# Patient Record
Sex: Male | Born: 1995 | Hispanic: Yes | Marital: Single | Smoking: Current some day smoker
Health system: Southern US, Community
[De-identification: ages and names within clinical notes are randomized; demographics above are authoritative.]

## PROBLEM LIST (undated history)

## (undated) DIAGNOSIS — J45909 Unspecified asthma, uncomplicated: Secondary | ICD-10-CM

---
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
## 2017-02-01 ENCOUNTER — Emergency Department
Admission: EM | Admit: 2017-02-01 | Discharge: 2017-02-01 | Disposition: A | Discharge status: Home / Self Care | Payer: Self-pay | Attending: Emergency Medicine | Admitting: Emergency Medicine

## 2017-02-01 ENCOUNTER — Other Ambulatory Visit: Payer: Self-pay

## 2017-02-01 DIAGNOSIS — J039 Acute tonsillitis, unspecified: Secondary | ICD-10-CM | POA: Insufficient documentation

## 2017-02-01 DIAGNOSIS — F17200 Nicotine dependence, unspecified, uncomplicated: Secondary | ICD-10-CM | POA: Insufficient documentation

## 2017-02-01 LAB — POCT RAPID STREP A: Streptococcus, Group A Screen (Direct): NEGATIVE

## 2017-02-01 MED ORDER — PSEUDOEPH-BROMPHEN-DM 30-2-10 MG/5ML PO SYRP: 5.0000 mL | ORAL_SOLUTION | Freq: Four times a day (QID) | ORAL | 0 refills | Status: AC | PRN

## 2017-02-01 MED ORDER — LIDOCAINE VISCOUS 2 % MT SOLN: 5.0000 mL | Freq: Four times a day (QID) | OROMUCOSAL | 0 refills | Status: AC | PRN

## 2017-02-01 MED ORDER — LIDOCAINE VISCOUS 2 % MT SOLN
15.0000 mL | Freq: Once | OROMUCOSAL | Status: AC
  Administered 2017-02-01: 15 mL via OROMUCOSAL
  Filled 2017-02-01: qty 15

## 2017-02-01 MED ORDER — ACETAMINOPHEN-CODEINE 120-12 MG/5ML PO SOLN
10.0000 mL | Freq: Once | ORAL | Status: AC
  Administered 2017-02-01: 10 mL via ORAL
  Filled 2017-02-01: qty 2

## 2017-02-01 MED ORDER — METHYLPREDNISOLONE 4 MG PO TBPK: ORAL_TABLET | ORAL | 0 refills | Status: AC

## 2017-02-01 MED ORDER — DEXAMETHASONE SODIUM PHOSPHATE 10 MG/ML IJ SOLN
10.0000 mg | Freq: Once | INTRAMUSCULAR | Status: AC
  Administered 2017-02-01: 10 mg via INTRAMUSCULAR
  Filled 2017-02-01: qty 1

## 2017-02-01 MED ORDER — ACETAMINOPHEN-CODEINE 120-12 MG/5ML PO SUSP: 10.0000 mL | Freq: Four times a day (QID) | ORAL | 0 refills | Status: AC | PRN

## 2017-02-01 NOTE — ED Notes (Signed)
Pt throat red and swollen, he has muffled speech and is unable to swallow his saliva

## 2017-02-01 NOTE — ED Triage Notes (Addendum)
Sore throat X 4 days. Pt has been taking Nyquil for pain. Runny nose. Pt alert and oriented X4, active, cooperative, pt in NAD. RR even and unlabored, color WNL.   Tonsils swollen, not touching. Pt spitting out saliva due to pain with swallowing. RR even and unlabored, color WNL.

## 2017-02-01 NOTE — ED Provider Notes (Signed)
[facility] Emergency Department Provider Note   ____________________________________________   None    (approximate)  I have reviewed the triage vital signs and the nursing notes.   HISTORY  Chief Complaint Sore Throat    HPI Jeffery Allison is a 21 y.o. male patient complaining of sore throat for 4 days. Patient state edematous tonsils and pain with swallowing.Patient also has nasal congestion intermittently runny nose. Patient has been taking NyQuil with mild transient relief. Patient rates pain as 8/10. Patient described a pain as "sore".   History reviewed. No pertinent past medical history.  There are no active problems to display for this patient.   History reviewed. No pertinent surgical history.  Prior to Admission medications   Medication Sig Start Date End Date Taking? Authorizing Provider  acetaminophen-codeine 120-12 MG/5ML suspension Take 10 mLs every 6 (six) hours as needed by mouth for pain. 02/01/17 02/01/18  Joni ReiningSmith, Ronald K, PA-C  brompheniramine-pseudoephedrine-DM 30-2-10 MG/5ML syrup Take 5 mLs 4 (four) times daily as needed by mouth. 02/01/17   Joni ReiningSmith, Ronald K, PA-C  lidocaine (XYLOCAINE) 2 % solution Use as directed 5 mLs every 6 (six) hours as needed in the mouth or throat for mouth pain. 02/01/17   Joni ReiningSmith, Ronald K, PA-C  methylPREDNISolone (MEDROL DOSEPAK) 4 MG TBPK tablet Take Tapered dose as directed 02/01/17   Joni ReiningSmith, Ronald K, PA-C    Allergies Patient has no known allergies.  No family history on file.  Social History Social History   Tobacco Use  . Smoking status: Current Some Day Smoker  Substance Use Topics  . Alcohol use: Yes    Frequency: Never  . Drug use: Not on file    Review of Systems  Constitutional: No fever/chills Eyes: No visual changes. ENT: Sore throat, nose congestion and runny nose Cardiovascular: Denies chest pain. Respiratory: Denies shortness of breath. Gastrointestinal: No  abdominal pain.  No nausea, no vomiting.  No diarrhea.  No constipation. Genitourinary: Negative for dysuria. Musculoskeletal: Negative for back pain. Skin: Negative for rash. Neurological: Negative for headaches, focal weakness or numbness.   ____________________________________________   PHYSICAL EXAM:  VITAL SIGNS: ED Triage Vitals [02/01/17 1346]  Enc Vitals Group     BP 140/88     Pulse Rate 82     Resp 18     Temp 98.8 F (37.1 C)     Temp Source Oral     SpO2 98 %     Weight 250 lb (113.4 kg)     Height 5\' 7"  (1.702 m)     Head Circumference      Peak Flow      Pain Score 8     Pain Loc      Pain Edu?      Excl. in GC?     Constitutional: Alert and oriented. Well appearing and in no acute distress. Eyes: Conjunctivae are normal. PERRL. EOMI. Head: Atraumatic. Nose: Edematous erythematous bilateral tonsils Mouth/Throat: Mucous membranes are moist.  Oropharynx non-erythematous. Neck: No stridor.  No cervical spine tenderness to palpation. Hematological/Lymphatic/Immunilogical: Bilateral cervical lymphadenopathy. Cardiovascular: Normal rate, regular rhythm. Grossly normal heart sounds.  Good peripheral circulation. Respiratory: Normal respiratory effort.  No retractions. Lungs CTAB. Neurologic:  Normal speech and language. No gross focal neurologic deficits are appreciated. No gait instability. Skin:  Skin is warm, dry and intact. No rash noted. Psychiatric: Mood and affect are normal. Speech and behavior are normal.  ____________________________________________   LABS (all labs ordered are listed, but  only abnormal results are displayed)  Labs Reviewed  POCT RAPID STREP A   ____________________________________________  EKG   ____________________________________________  RADIOLOGY  No results found.  ____________________________________________   PROCEDURES  Procedure(s) performed: None  Procedures  Critical Care performed:  No  ____________________________________________   INITIAL IMPRESSION / ASSESSMENT AND PLAN / ED COURSE  As part of my medical decision making, I reviewed the following data within the electronic MEDICAL RECORD NUMBER    Sore throat secondary to tonsillitis. Patient advised a rapid strep test was negative culture is pending. Patient given supportive care consisting of viscous lidocaine, Decadron injection, and Tylenol elixir with codeine. Patient given discharge care instruction work no. Patient advised to take medication as directed. Patient advised follow-up with the ENT clinic if no improvement in 5 days. Return to ED if condition worsens.    ____________________________________________   FINAL CLINICAL IMPRESSION(S) / ED DIAGNOSES  Final diagnoses:  Tonsillitis     ED Discharge Orders        Ordered    methylPREDNISolone (MEDROL DOSEPAK) 4 MG TBPK tablet     02/01/17 1519    acetaminophen-codeine 120-12 MG/5ML suspension  Every 6 hours PRN     02/01/17 1519    brompheniramine-pseudoephedrine-DM 30-2-10 MG/5ML syrup  4 times daily PRN     02/01/17 1519    lidocaine (XYLOCAINE) 2 % solution  Every 6 hours PRN     02/01/17 1520       Note:  This document was prepared using Dragon voice recognition software and may include unintentional dictation errors.    Joni ReiningSmith, Ronald K, PA-C 02/01/17 1528    Nita SickleVeronese, Allen, MD 02/07/17 [phone]

## 2017-02-01 NOTE — ED Notes (Signed)
Pt no longer has muffled speech. Is able to swallow. Ambulatory to POV with family member. VSS. NAD. Respirations are equal and non-labored. Skin PWD. No questions or concerns during discharge.

## 2017-02-01 NOTE — Discharge Instructions (Signed)
Take viscous lidocaine to swish and swallow prior to taking other medications

## 2017-02-03 ENCOUNTER — Other Ambulatory Visit: Payer: Self-pay

## 2017-02-03 ENCOUNTER — Emergency Department
Admission: EM | Admit: 2017-02-03 | Discharge: 2017-02-03 | Disposition: A | Discharge status: Home / Self Care | Payer: Self-pay | Attending: Emergency Medicine | Admitting: Emergency Medicine

## 2017-02-03 ENCOUNTER — Encounter: Payer: Self-pay | Admitting: Emergency Medicine

## 2017-02-03 DIAGNOSIS — F17200 Nicotine dependence, unspecified, uncomplicated: Secondary | ICD-10-CM | POA: Insufficient documentation

## 2017-02-03 DIAGNOSIS — J039 Acute tonsillitis, unspecified: Secondary | ICD-10-CM | POA: Insufficient documentation

## 2017-02-03 LAB — POCT RAPID STREP A: STREPTOCOCCUS, GROUP A SCREEN (DIRECT): NEGATIVE

## 2017-02-03 MED ORDER — AMOXICILLIN-POT CLAVULANATE 875-125 MG PO TABS: 1.0000 | ORAL_TABLET | Freq: Two times a day (BID) | ORAL | 0 refills | Status: AC

## 2017-02-03 NOTE — Discharge Instructions (Signed)
Please take antibiotics as prescribed.  Continue with other medications.  You may also take ibuprofen as needed for additional pain relief.  If any difficulty swallowing, return to the emergency department.

## 2017-02-03 NOTE — ED Triage Notes (Signed)
States sore throat x 1 week, states now notices sores in mouth.

## 2017-02-03 NOTE — ED Provider Notes (Signed)
[facility] EMERGENCY DEPARTMENT Provider Note   CSN: [id number] Arrival date & time: 02/03/17  [id number]     History   Chief Complaint Chief Complaint  Patient presents with  . Sore Throat    HPI Jeffery Allison is a 21 y.o. male resents to the emergency department for evaluation of sore throat pain.  Patient has had 6 days of sore throat.  Was seen 2 days ago at the emergency department and diagnosed with tonsillitis.  Rapid strep test was negative.  He was given a prescription for lidocaine solution, Medrol Dosepak, Tylenol with codeine suspension.  Patient states tonsillar swelling significantly improved but now he is having a lot of redness and pain in his uvula.  He is also having some continued sore throat pain.  He denies any difficulty swallowing, opening his mouth or hot potato voice.  He is not drooling.  Pain is moderate but improved with current medications.  He denies any rashes, chest pain, abdominal pain.  HPI  History reviewed. No pertinent past medical history.  There are no active problems to display for this patient.   History reviewed. No pertinent surgical history.     Home Medications    Prior to Admission medications   Medication Sig Start Date End Date Taking? Authorizing Provider  acetaminophen-codeine 120-12 MG/5ML suspension Take 10 mLs every 6 (six) hours as needed by mouth for pain. 02/01/17 02/01/18  Joni ReiningSmith, Ronald K, PA-C  amoxicillin-clavulanate (AUGMENTIN) 875-125 MG tablet Take 1 tablet every 12 (twelve) hours for 10 days by mouth. 02/03/17 02/13/17  Evon SlackGaines, Thomas C, PA-C  brompheniramine-pseudoephedrine-DM 30-2-10 MG/5ML syrup Take 5 mLs 4 (four) times daily as needed by mouth. 02/01/17   Joni ReiningSmith, Ronald K, PA-C  lidocaine (XYLOCAINE) 2 % solution Use as directed 5 mLs every 6 (six) hours as needed in the mouth or throat for mouth pain. 02/01/17   Joni ReiningSmith, Ronald K, PA-C  methylPREDNISolone (MEDROL DOSEPAK) 4 MG TBPK tablet  Take Tapered dose as directed 02/01/17   Joni ReiningSmith, Ronald K, PA-C    Family History No family history on file.  Social History Social History   Tobacco Use  . Smoking status: Current Some Day Smoker  Substance Use Topics  . Alcohol use: Yes    Frequency: Never  . Drug use: Not on file     Allergies   Patient has no known allergies.   Review of Systems Review of Systems  Constitutional: Negative.  Negative for activity change, appetite change, chills and fever.  HENT: Positive for sore throat. Negative for congestion, ear pain, mouth sores, rhinorrhea, sinus pressure and trouble swallowing.   Eyes: Negative for photophobia, pain and discharge.  Respiratory: Negative for cough, chest tightness and shortness of breath.   Cardiovascular: Negative for chest pain and leg swelling.  Gastrointestinal: Negative for abdominal distention, abdominal pain, diarrhea, nausea and vomiting.  Genitourinary: Negative for difficulty urinating and dysuria.  Musculoskeletal: Negative for arthralgias, back pain and gait problem.  Skin: Negative for color change and rash.  Neurological: Negative for dizziness and headaches.  Hematological: Negative for adenopathy.  Psychiatric/Behavioral: Negative for agitation and behavioral problems.     Physical Exam Updated Vital Signs BP (!) 110/55   Pulse 67   Temp (!) 97.5 F (36.4 C) (Oral)   Resp 20   Ht 5\' 6"  (1.676 m)   Wt 108.9 kg (240 lb)   SpO2 98%   BMI 38.74 kg/m   Physical Exam  Constitutional: He is oriented to  person, place, and time. He appears well-developed and well-nourished.  HENT:  Head: Normocephalic and atraumatic.  Right Ear: External ear normal.  Left Ear: External ear normal.  Nose: Nose normal.  Mouth/Throat: No oropharyngeal exudate.  No tonsillar swelling or exudates.  Mild tonsillar erythema.  Uvula is midline with no swelling or abscess formation but uvula is erythematous.  Uvula is tender to palpation.   Eyes:  Conjunctivae are normal.  Neck: Normal range of motion.  Cardiovascular: Normal rate, regular rhythm and normal heart sounds.  Pulmonary/Chest: Effort normal. No stridor. No respiratory distress. He has no wheezes. He has no rales.  Abdominal: Soft. There is no tenderness.  no left upper quadrant tenderness.  Musculoskeletal: Normal range of motion.  Lymphadenopathy:    He has cervical adenopathy.  Neurological: He is alert and oriented to person, place, and time.  Skin: Skin is warm. No rash noted.  Psychiatric: He has a normal mood and affect. His behavior is normal. Thought content normal.     ED Treatments / Results  Labs (all labs ordered are listed, but only abnormal results are displayed) Labs Reviewed  CULTURE, GROUP A STREP [facility]  POCT RAPID STREP A    EKG  EKG Interpretation None       Radiology No results found.  Procedures Procedures (including critical care time)  Medications Ordered in ED Medications - No data to display   Initial Impression / Assessment and Plan / ED Course  I have reviewed the triage vital signs and the nursing notes.  Pertinent labs & imaging results that were available during my care of the patient were reviewed by me and considered in my medical decision making (see chart for details).     21 year old male with tonsillitis.  Rapid strep test today is negative but cultures are sent.  He has had persistent sore throat with absence of cough congestion runny nose.  He has had some body aches.  Due to persistent pain and increased in uvula redness and pain will start antibiotic.  He is educated on signs and symptoms return to the ED for such as any trismus, difficulty swallowing, increasing pain, fevers, worsening symptoms or urgent changes in his health.  Final Clinical Impressions(s) / ED Diagnoses   Final diagnoses:  Tonsillitis    ED Discharge Orders        Ordered    amoxicillin-clavulanate (AUGMENTIN) 875-125 MG tablet   Every 12 hours     02/03/17 0934       Evon SlackGaines, Thomas C, PA-C 02/03/17 [phone]    Pershing ProudSchaevitz, Myra Rudeavid Matthew, MD 02/03/17 [phone]

## 2017-02-04 ENCOUNTER — Encounter: Payer: Self-pay | Admitting: Emergency Medicine

## 2017-02-04 ENCOUNTER — Emergency Department: Payer: Self-pay

## 2017-02-04 ENCOUNTER — Emergency Department
Admission: EM | Admit: 2017-02-04 | Discharge: 2017-02-04 | Disposition: A | Discharge status: Home / Self Care | Payer: Self-pay | Attending: Emergency Medicine | Admitting: Emergency Medicine

## 2017-02-04 ENCOUNTER — Other Ambulatory Visit: Payer: Self-pay

## 2017-02-04 DIAGNOSIS — F17200 Nicotine dependence, unspecified, uncomplicated: Secondary | ICD-10-CM | POA: Insufficient documentation

## 2017-02-04 DIAGNOSIS — J039 Acute tonsillitis, unspecified: Secondary | ICD-10-CM | POA: Insufficient documentation

## 2017-02-04 MED ORDER — IOPAMIDOL (ISOVUE-300) INJECTION 61%
75.0000 mL | Freq: Once | INTRAVENOUS | Status: AC | PRN
  Administered 2017-02-04: 75 mL via INTRAVENOUS

## 2017-02-04 MED ORDER — KETOROLAC TROMETHAMINE 30 MG/ML IJ SOLN
30.0000 mg | Freq: Once | INTRAMUSCULAR | Status: AC
  Administered 2017-02-04: 30 mg via INTRAVENOUS
  Filled 2017-02-04: qty 1

## 2017-02-04 NOTE — Discharge Instructions (Signed)
Please continue to take your antibiotic as prescribed previous.  Follow-up closely with ear nose and throat, Dr. Andee PolesVaught.  Call tomorrow to schedule a visit as soon as possible.  Return to the emergency room right away if you develop difficulty breathing, or unable to swallow, develop high fever, vomiting, feel weak dizzy, develop a severe headache or other new concerns arise.

## 2017-02-04 NOTE — ED Provider Notes (Signed)
[facility] Emergency Department Provider Note   ____________________________________________   First MD Initiated Contact with Patient 02/04/17 1134     (approximate)  I have reviewed the triage vital signs and the nursing notes.   HISTORY  Chief Complaint Sore Throat    HPI Jeffery Allison is a 21 y.o. male here for evaluation of sore throat  Patient reports he has had a sore throat for about a week, he started an antibiotic yesterday and is taken 2 doses.  Reports he continues to feel like he has a very sore throat, makes it painful to swallow.  He is able to speak, denies any trouble speaking, denies any shortness of breath, no fever but did have some body aches and chills earlier in the week.  He just reports that he is continued to feel very sore in his throat, and when he swallows it hurts quite a bit.  No chest pain or trouble breathing.  No nausea or vomiting.  Currently on Augmentin, reports taking 2 doses.  Pain reported as moderate to severe with swallowing.  History reviewed. No pertinent past medical history.  There are no active problems to display for this patient.   History reviewed. No pertinent surgical history.  Prior to Admission medications   Medication Sig Start Date End Date Taking? Authorizing Provider  acetaminophen-codeine 120-12 MG/5ML suspension Take 10 mLs every 6 (six) hours as needed by mouth for pain. 02/01/17 02/01/18  Joni Reining, PA-C  amoxicillin-clavulanate (AUGMENTIN) 875-125 MG tablet Take 1 tablet every 12 (twelve) hours for 10 days by mouth. 02/03/17 02/13/17  Evon Slack, PA-C  brompheniramine-pseudoephedrine-DM 30-2-10 MG/5ML syrup Take 5 mLs 4 (four) times daily as needed by mouth. 02/01/17   Joni Reining, PA-C  lidocaine (XYLOCAINE) 2 % solution Use as directed 5 mLs every 6 (six) hours as needed in the mouth or throat for mouth pain. 02/01/17   Joni Reining, PA-C  methylPREDNISolone (MEDROL  DOSEPAK) 4 MG TBPK tablet Take Tapered dose as directed 02/01/17   Joni Reining, PA-C    Allergies Patient has no known allergies.  No family history on file.  Social History Social History   Tobacco Use  . Smoking status: Current Some Day Smoker  . Smokeless tobacco: Never Used  Substance Use Topics  . Alcohol use: Yes    Frequency: Never  . Drug use: Not on file    Review of Systems Constitutional: No fever/chills but some body aches over the last week Eyes: No visual changes. ENT: See HPI Cardiovascular: Denies chest pain. Respiratory: Denies shortness of breath. Gastrointestinal: No abdominal pain.  No nausea, no vomiting.  No diarrhea.  No constipation. Genitourinary: Negative for dysuria. Musculoskeletal: Negative for back pain. Skin: Negative for rash. Neurological: Negative for headaches, focal weakness or numbness.    ____________________________________________   PHYSICAL EXAM:  VITAL SIGNS: ED Triage Vitals [02/04/17 0958]  Enc Vitals Group     BP 119/70     Pulse Rate 95     Resp 16     Temp 98.7 F (37.1 C)     Temp Source Oral     SpO2 99 %     Weight 240 lb (108.9 kg)     Height 5\' 6"  (1.676 m)     Head Circumference      Peak Flow      Pain Score      Pain Loc      Pain Edu?  Excl. in GC?     Constitutional: Alert and oriented. Well appearing and in no acute distress. Eyes: Conjunctivae are normal. Head: Atraumatic. Nose: No congestion/rhinnorhea. Mouth/Throat: Mucous membranes are moist.  He has bilateral tonsillar hypertrophy, uvula is midline and does not appear swollen.  There is erythema of the tonsillar pillars, and the left tonsil appears slightly more swollen than the right.  No purulence is noted. Neck: No stridor.  No muffled voice.  No anterior neck swelling.  Some mild anterior cervical adenopathy bilateral. Cardiovascular: Normal rate, regular rhythm. Grossly normal heart sounds.  Good peripheral  circulation. Respiratory: Normal respiratory effort.  No retractions. Lungs CTAB. Gastrointestinal: Soft and nontender. No distention. Musculoskeletal: No lower extremity tenderness nor edema. Neurologic:  Normal speech and language. No gross focal neurologic deficits are appreciated.  Skin:  Skin is warm, dry and intact. No rash noted. Psychiatric: Mood and affect are normal. Speech and behavior are normal.  ____________________________________________   LABS (all labs ordered are listed, but only abnormal results are displayed)  Labs Reviewed - No data to display ____________________________________________  EKG   ____________________________________________  RADIOLOGY  Ct Soft Tissue Neck W Contrast  Result Date: 02/04/2017 CLINICAL DATA:  Left-sided neck pain for 1 week. The patient is currently on antibiotics. Sore throat/stridor, epiglottis or tonsillitis suspected. EXAM: CT NECK WITH CONTRAST TECHNIQUE: Multidetector CT imaging of the neck was performed using the standard protocol following the bolus administration of intravenous contrast. CONTRAST:  75mL ISOVUE-300 IOPAMIDOL (ISOVUE-300) INJECTION 61% COMPARISON:  None. FINDINGS: Pharynx and larynx: The palatine tonsils are enlarged bilaterally, left greater than right. There is heterogeneous enhancement of the left palatine tonsil with an ill-defined low-density area measurement 1.9 cm. No discrete drainable abscess is present. Inflammatory changes extend into the left parapharyngeal fat. Edematous changes extend to the aryepiglottic fold on the left and into the posterior left oropharynx. The airway is patent. Soft palate is no epiglottis are within normal limits. Vocal cords are midline and symmetric. Salivary glands: Submandibular and parotid glands are normal bilaterally. Thyroid: Normal. Lymph nodes: Left than right enlarged level 2 and level 3 lymph nodes are likely reactive. Vascular: Negative. Limited intracranial: Within  normal limits. Visualized orbits: Visualized orbits are normal. Mastoids and visualized paranasal sinuses: The paranasal sinuses and mastoid air cells are clear. Skeleton: Vertebral body heights alignment are within normal limits. No focal lytic or blastic lesions are present. Upper chest: The lung apices are clear. IMPRESSION: 1. Left-sided tonsillitis and pharyngitis with inflammatory changes of the left palatine tonsil extending into the left parapharyngeal fat. 2. Low-density area within the lateral aspect of the left palatine tonsil without a discrete drainable abscess. 3. Reactive left-sided cervical adenopathy. Electronically Signed   By: Marin Robertshristopher  Mattern M.D.   On: 02/04/2017 12:49   CT reviewed, no evidence of an acute abscess.  Consistent with tonsillitis/pharyngitis.  Airway is patent  ____________________________________________   PROCEDURES  Procedure(s) performed: None  Procedures  Critical Care performed: No  ____________________________________________   INITIAL IMPRESSION / ASSESSMENT AND PLAN / ED COURSE  Pertinent labs & imaging results that were available during my care of the patient were reviewed by me and considered in my medical decision making (see chart for details).  Patient presents with an obvious tonsillitis, but given his representation today and notably some slightly asymmetric tonsillar hypertrophy, primarily showing more swelling on the left than the right I will order a CT soft tissue to evaluate and exclude abscess.  Overall I suspect he likely  suffering from acute tonsillitis, just started Augmentin and I would not yet expect a significant response to therapy.  He is nontoxic and well-appearing, does not appear to be septic.       ----------------------------------------- 12:54 PM on 02/04/2017 -----------------------------------------  Patient resting comfortably in no distress.  Reviewed CT findings, recommendations to continue on with his  current treatment including antibiotic as previously prescribed and follow-up with your nose and throat. Return precautions and treatment recommendations and follow-up discussed with the patient who is agreeable with the plan.  ____________________________________________   FINAL CLINICAL IMPRESSION(S) / ED DIAGNOSES  Final diagnoses:  Acute tonsillitis, unspecified etiology      NEW MEDICATIONS STARTED DURING THIS VISIT:  This SmartLink is deprecated. Use AVSMEDLIST instead to display the medication list for a patient.   Note:  This document was prepared using Dragon voice recognition software and may include unintentional dictation errors.     Sharyn CreamerQuale, Eline Geng, MD 02/04/17 1255

## 2017-02-04 NOTE — ED Notes (Signed)
Patient transported to CT at this time. 

## 2017-02-04 NOTE — ED Triage Notes (Signed)
Has been seen through ED three times for c/o sore throat.  Seen most recently yesterday.  Returns today for same complaint.  Started on an antibiotic yesterday.

## 2017-02-05 LAB — CULTURE, GROUP A STREP (THRC)

## 2017-02-06 NOTE — Progress Notes (Signed)
ED CULTURE REPORT   Microbiology: Recent Results (from the past 720 hour(s))  Culture, group A strep     Status: None   Collection Time: 02/03/17  9:20 AM  Result Value Ref Range Status   Specimen Description THROAT  Final   Special Requests NONE  Final   Culture ABUNDANT GROUP A STREP (S.PYOGENES) ISOLATED  Final   Report Status 02/05/2017 FINAL  Final    21 yo male admitted to ED with sore throat. Patient was discharged with 10 days of Augmentin.  ED culture has since grown Group A Strep. Spoke with ED MD Dr. Roxan Hockeyobinson who agreed that no further action is needed at this time.    Yolanda BonineHannah Lifsey, PharmD Pharmacy Resident 02/06/2017,4:06 PM

## 2017-10-18 ENCOUNTER — Emergency Department
Admission: EM | Admit: 2017-10-18 | Discharge: 2017-10-18 | Disposition: A | Discharge status: Home / Self Care | Payer: Self-pay | Attending: Emergency Medicine | Admitting: Emergency Medicine

## 2017-10-18 ENCOUNTER — Other Ambulatory Visit: Payer: Self-pay

## 2017-10-18 DIAGNOSIS — Z5321 Procedure and treatment not carried out due to patient leaving prior to being seen by health care provider: Secondary | ICD-10-CM | POA: Insufficient documentation

## 2017-10-18 DIAGNOSIS — R1110 Vomiting, unspecified: Secondary | ICD-10-CM | POA: Insufficient documentation

## 2017-10-18 HISTORY — DX: Unspecified asthma, uncomplicated: J45.909

## 2017-10-18 LAB — CBC
HCT: 51.8 % (ref 40.0–52.0)
Hemoglobin: 18.1 g/dL — ABNORMAL HIGH (ref 13.0–18.0)
MCH: 30.6 pg (ref 26.0–34.0)
MCHC: 34.9 g/dL (ref 32.0–36.0)
MCV: 87.7 fL (ref 80.0–100.0)
Platelets: 399 10*3/uL (ref 150–440)
RBC: 5.91 MIL/uL — AB (ref 4.40–5.90)
RDW: 12.9 % (ref 11.5–14.5)
WBC: 11.8 10*3/uL — AB (ref 3.8–10.6)

## 2017-10-18 LAB — COMPREHENSIVE METABOLIC PANEL
ALK PHOS: 105 U/L (ref 38–126)
ALT: 59 U/L — AB (ref 0–44)
AST: 84 U/L — ABNORMAL HIGH (ref 15–41)
Albumin: 5.4 g/dL — ABNORMAL HIGH (ref 3.5–5.0)
Anion gap: 14 (ref 5–15)
BUN: 46 mg/dL — ABNORMAL HIGH (ref 6–20)
CALCIUM: 9.7 mg/dL (ref 8.9–10.3)
CO2: 25 mmol/L (ref 22–32)
CREATININE: 1.36 mg/dL — AB (ref 0.61–1.24)
Chloride: 97 mmol/L — ABNORMAL LOW (ref 98–111)
Glucose, Bld: 114 mg/dL — ABNORMAL HIGH (ref 70–99)
Potassium: 3.5 mmol/L (ref 3.5–5.1)
Sodium: 136 mmol/L (ref 135–145)
Total Bilirubin: 2 mg/dL — ABNORMAL HIGH (ref 0.3–1.2)
Total Protein: 9.2 g/dL — ABNORMAL HIGH (ref 6.5–8.1)

## 2017-10-18 LAB — LIPASE, BLOOD: Lipase: 23 U/L (ref 11–51)

## 2017-10-18 NOTE — ED Notes (Signed)
Patient called, no answer. [employer] officer reported he saw patient leave the lobby

## 2017-10-18 NOTE — ED Notes (Signed)
Patient called, no answer.

## 2017-10-18 NOTE — ED Notes (Signed)
Pt called to notify of abnormal lab results to request pt to return for further evaluation. Pt hung up and unable to relay information to pt at this time.

## 2017-10-18 NOTE — ED Triage Notes (Signed)
Pt arrives to ED via POV from home with c/o emesis since Monday and hasn't been able to keep anything down. Pt reports non-stop vomiting of "stomach acid". Pt denies fever, no diarrhea. Pt denies CP, abdominal pain or SHOB. No recent sick contacts or family members with similar s/x's.

## 2017-10-23 ENCOUNTER — Encounter: Payer: Self-pay | Admitting: Emergency Medicine

## 2017-10-23 ENCOUNTER — Emergency Department
Admission: EM | Admit: 2017-10-23 | Discharge: 2017-10-23 | Disposition: A | Discharge status: Home / Self Care | Payer: Self-pay | Attending: Emergency Medicine | Admitting: Emergency Medicine

## 2017-10-23 ENCOUNTER — Other Ambulatory Visit: Payer: Self-pay

## 2017-10-23 ENCOUNTER — Telehealth: Payer: Self-pay | Admitting: Emergency Medicine

## 2017-10-23 DIAGNOSIS — J45909 Unspecified asthma, uncomplicated: Secondary | ICD-10-CM | POA: Insufficient documentation

## 2017-10-23 DIAGNOSIS — F17200 Nicotine dependence, unspecified, uncomplicated: Secondary | ICD-10-CM | POA: Insufficient documentation

## 2017-10-23 DIAGNOSIS — R7989 Other specified abnormal findings of blood chemistry: Secondary | ICD-10-CM

## 2017-10-23 DIAGNOSIS — R945 Abnormal results of liver function studies: Secondary | ICD-10-CM | POA: Insufficient documentation

## 2017-10-23 LAB — COMPREHENSIVE METABOLIC PANEL
ALBUMIN: 4.2 g/dL (ref 3.5–5.0)
ALT: 46 U/L — AB (ref 0–44)
ANION GAP: 8 (ref 5–15)
AST: 44 U/L — ABNORMAL HIGH (ref 15–41)
Alkaline Phosphatase: 75 U/L (ref 38–126)
BUN: 22 mg/dL — ABNORMAL HIGH (ref 6–20)
CHLORIDE: 104 mmol/L (ref 98–111)
CO2: 29 mmol/L (ref 22–32)
Calcium: 9 mg/dL (ref 8.9–10.3)
Creatinine, Ser: 1.05 mg/dL (ref 0.61–1.24)
GFR calc non Af Amer: >60 mL/min (ref >60)
GLUCOSE: 88 mg/dL (ref 70–99)
Potassium: 4.1 mmol/L (ref 3.5–5.1)
SODIUM: 141 mmol/L (ref 135–145)
Total Bilirubin: 0.4 mg/dL (ref 0.3–1.2)
Total Protein: 7.1 g/dL (ref 6.5–8.1)

## 2017-10-23 LAB — LIPASE, BLOOD: Lipase: 27 U/L (ref 11–51)

## 2017-10-23 LAB — CBC WITH DIFFERENTIAL/PLATELET
BASOS PCT: 0 %
Basophils Absolute: 0 10*3/uL (ref 0–0.1)
Eosinophils Absolute: 0.1 10*3/uL (ref 0–0.7)
Eosinophils Relative: 1 %
HEMATOCRIT: 39.4 % — AB (ref 40.0–52.0)
HEMOGLOBIN: 13.6 g/dL (ref 13.0–18.0)
LYMPHS ABS: 2.5 10*3/uL (ref 1.0–3.6)
Lymphocytes Relative: 22 %
MCH: 30.7 pg (ref 26.0–34.0)
MCHC: 34.6 g/dL (ref 32.0–36.0)
MCV: 88.7 fL (ref 80.0–100.0)
MONO ABS: 0.9 10*3/uL (ref 0.2–1.0)
MONOS PCT: 7 %
NEUTROS ABS: 8.2 10*3/uL — AB (ref 1.4–6.5)
NEUTROS PCT: 70 %
Platelets: 299 10*3/uL (ref 150–440)
RBC: 4.44 MIL/uL (ref 4.40–5.90)
RDW: 12.6 % (ref 11.5–14.5)
WBC: 11.8 10*3/uL — ABNORMAL HIGH (ref 3.8–10.6)

## 2017-10-23 MED ORDER — SODIUM CHLORIDE 0.9 % IV BOLUS
1000.0000 mL | Freq: Once | INTRAVENOUS | Status: AC
  Administered 2017-10-23: 1000 mL via INTRAVENOUS

## 2017-10-23 NOTE — ED Provider Notes (Signed)
[facility] Emergency Department Provider Note  ____________________________________________   I have reviewed the triage vital signs and the nursing notes. Where available I have reviewed prior notes and, if possible and indicated, outside hospital notes.    HISTORY  Chief Complaint Abnormal Labs    HPI Jeffery Allison is a 22 y.o. male who presented here a few days ago, 5, because of nausea vomiting and diarrhea, all of the symptoms is resolved since he went home but when he was home, he was called and told that he had abnormal labs he came in for that reason.  He has no other complaints.  He came in only because of our telephone call.  He has not vomited in several days, he has no diarrhea and he has no complaints    Past Medical History:  Diagnosis Date  . Asthma     There are no active problems to display for this patient.   History reviewed. No pertinent surgical history.  Prior to Admission medications   Medication Sig Start Date End Date Taking? Authorizing Provider  acetaminophen-codeine 120-12 MG/5ML suspension Take 10 mLs every 6 (six) hours as needed by mouth for pain. 02/01/17 02/01/18  Joni ReiningSmith, Ronald K, PA-C  brompheniramine-pseudoephedrine-DM 30-2-10 MG/5ML syrup Take 5 mLs 4 (four) times daily as needed by mouth. 02/01/17   Joni ReiningSmith, Ronald K, PA-C  lidocaine (XYLOCAINE) 2 % solution Use as directed 5 mLs every 6 (six) hours as needed in the mouth or throat for mouth pain. 02/01/17   Joni ReiningSmith, Ronald K, PA-C  methylPREDNISolone (MEDROL DOSEPAK) 4 MG TBPK tablet Take Tapered dose as directed 02/01/17   Joni ReiningSmith, Ronald K, PA-C    Allergies Patient has no known allergies.  No family history on file.  Social History Social History   Tobacco Use  . Smoking status: Current Some Day Smoker  . Smokeless tobacco: Never Used  Substance Use Topics  . Alcohol use: Yes    Frequency: Never  . Drug use: Not on file    Review of  Systems Constitutional: No fever/chills Eyes: No visual changes. ENT: No sore throat. No stiff neck no neck pain Cardiovascular: Denies chest pain. Respiratory: Denies shortness of breath. Gastrointestinal:   no vomiting.  No diarrhea.  No constipation. Genitourinary: Negative for dysuria. Musculoskeletal: Negative lower extremity swelling Skin: Negative for rash. Neurological: Negative for severe headaches, focal weakness or numbness.   ____________________________________________   PHYSICAL EXAM:  VITAL SIGNS: ED Triage Vitals [10/23/17 1726]  Enc Vitals Group     BP 129/63     Pulse Rate 87     Resp 18     Temp 98.7 F (37.1 C)     Temp Source Oral     SpO2 99 %     Weight 250 lb (113.4 kg)     Height 5\' 3"  (1.6 m)     Head Circumference      Peak Flow      Pain Score 0     Pain Loc      Pain Edu?      Excl. in GC?     Constitutional: Alert and oriented. Well appearing and in no acute distress. Eyes: Conjunctivae are normal Head: Atraumatic HEENT: No congestion/rhinnorhea. Mucous membranes are moist.  Oropharynx non-erythematous Neck:   Nontender with no meningismus, no masses, no stridor Cardiovascular: Normal rate, regular rhythm. Grossly normal heart sounds.  Good peripheral circulation. Respiratory: Normal respiratory effort.  No retractions. Lungs CTAB. Abdominal: Soft and nontender.  No distention. No guarding no rebound Back:  There is no focal tenderness or step off.  there is no midline tenderness there are no lesions noted. there is no CVA tenderness Musculoskeletal: No lower extremity tenderness, no upper extremity tenderness. No joint effusions, no DVT signs strong distal pulses no edema Neurologic:  Normal speech and language. No gross focal neurologic deficits are appreciated.  Skin:  Skin is warm, dry and intact. No rash noted. Psychiatric: Mood and affect are normal. Speech and behavior are normal.  ____________________________________________    LABS (all labs ordered are listed, but only abnormal results are displayed)  Labs Reviewed  COMPREHENSIVE METABOLIC PANEL - Abnormal; Notable for the following components:      Result Value   BUN 22 (*)    AST 44 (*)    ALT 46 (*)    All other components within normal limits  CBC WITH DIFFERENTIAL/PLATELET - Abnormal; Notable for the following components:   WBC 11.8 (*)    HCT 39.4 (*)    Neutro Abs 8.2 (*)    All other components within normal limits  LIPASE, BLOOD  URINALYSIS, COMPLETE (UACMP) WITH MICROSCOPIC    Pertinent labs  results that were available during my care of the patient were reviewed by me and considered in my medical decision making (see chart for details). ____________________________________________  EKG  I personally interpreted any EKGs ordered by me or triage  ____________________________________________  RADIOLOGY  Pertinent labs & imaging results that were available during my care of the patient were reviewed by me and considered in my medical decision making (see chart for details). If possible, patient and/or family made aware of any abnormal findings.  No results found. ____________________________________________    PROCEDURES  Procedure(s) performed: None  Procedures  Critical Care performed: None  ____________________________________________   INITIAL IMPRESSION / ASSESSMENT AND PLAN / ED COURSE  Pertinent labs & imaging results that were available during my care of the patient were reviewed by me and considered in my medical decision making (see chart for details). Patient had elevated liver function test and appeared dehydrated during a episode of diarrhea and vomiting last week, we called him back to get his liver function tests were elevated, we are able to recheck those, they have almost all correct completely.  Belly is benign he has no ongoing complaints or symptoms.  He has not been vomiting for 4 days, kidney function is  improved, no longer looks to be dehydrated.  Transaminases are ameliorated.  Bilirubin is corrected.  Possible that he passed a gallbladder stone, however, he has no tenderness at this time we will refer him to surgery as an outpatient he is very comfort with this plan has no complaints must go home.  Return precautions follow-up given and understood.    ____________________________________________   FINAL CLINICAL IMPRESSION(S) / ED DIAGNOSES  Final diagnoses:  None      This chart was dictated using voice recognition software.  Despite best efforts to proofread,  errors can occur which can change meaning.       Jeanmarie Plant, MD 10/23/17 [phone]

## 2017-10-23 NOTE — ED Triage Notes (Signed)
Pt presents to ED via POV, states came in on 8/1 for feeling sick but "they were taking too long" so he left prior to being seen by MD, pt states received a call about abnormal labs regarding "either his lungs or his liver". Pt returns today due to abnormal labs but states he feels better than he did on 8/1.

## 2017-10-23 NOTE — Discharge Instructions (Addendum)
We called you back to the emergency room because your liver function tests were elevated.  They have almost completely corrected.  If you have vomiting or abdominal pain it is possible that you may have gallbladder disease but at this time, everything looks okay.  Continue to drink plenty of fluids, follow closely with surgery in the outpatient setting as a precaution, return to the emergency room if you have any new or worsening symptoms specifically persistent vomiting or abdominal pain.

## 2017-10-23 NOTE — Telephone Encounter (Signed)
Called patient due to lwot to inquire about condition and follow up plans.  He is saying he feels better.  I explained that he had concerning labs regarding his kidneys and liver.  He does not have a pcp or insurance.  He asked me to speak to his sister.  I spoke to her and explained that he really needs to have labs repeated.  I told her about urgent care and acute care, but he does not have insurance.  I told her he can always return here.

## 2018-10-02 IMAGING — CT CT NECK W/ CM
4 of 5 series · 16 of 33 positions shown, 18 images · IV contrast (iopamidol)
Comparison: None.

CLINICAL DATA: Left-sided neck pain for 1 week. The patient is
currently on antibiotics. Sore throat/stridor, epiglottis or
tonsillitis suspected.

EXAM:
CT NECK WITH CONTRAST
TECHNIQUE: Multidetector CT imaging of the neck was performed using the
standard protocol following the bolus administration of intravenous
contrast.
CONTRAST:  75mL KGMSDT-D44 IOPAMIDOL (KGMSDT-D44) INJECTION 61%

[Series 2: axial neck · axial · 0.52mm/px · z∈[+368,+506]mm · 4 of 116 slices shown, 5 images]
[im 24/116  soft-tissue]
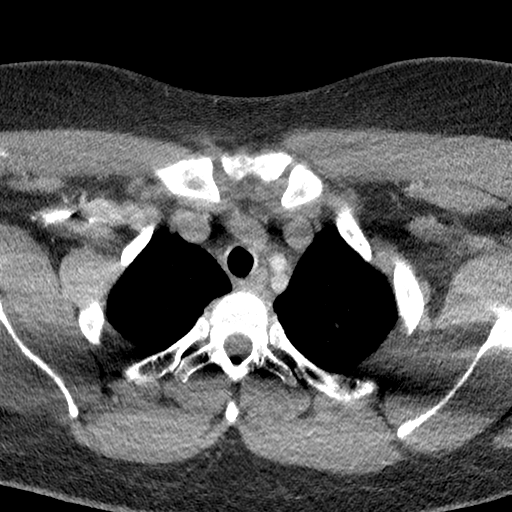
[im 24/116  bone]
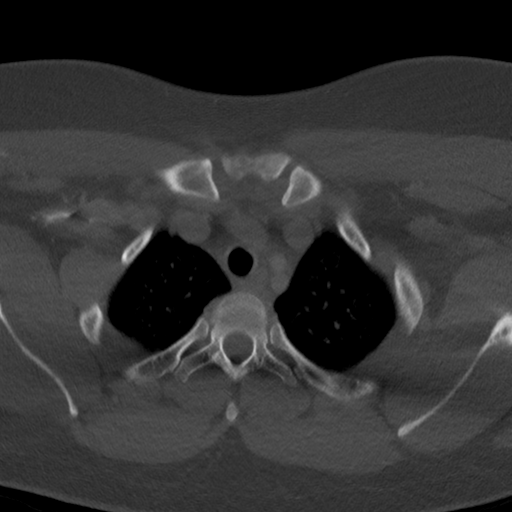
[im 47/116  bone]
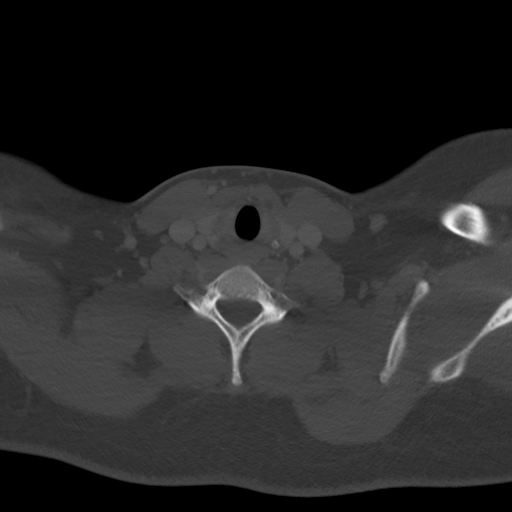
[im 70/116  bone]
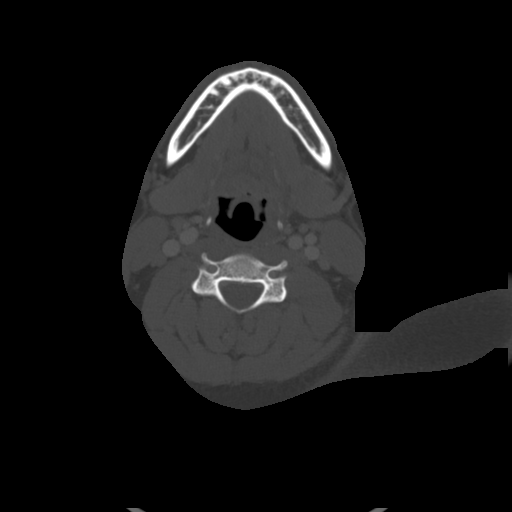
[im 93/116  bone]
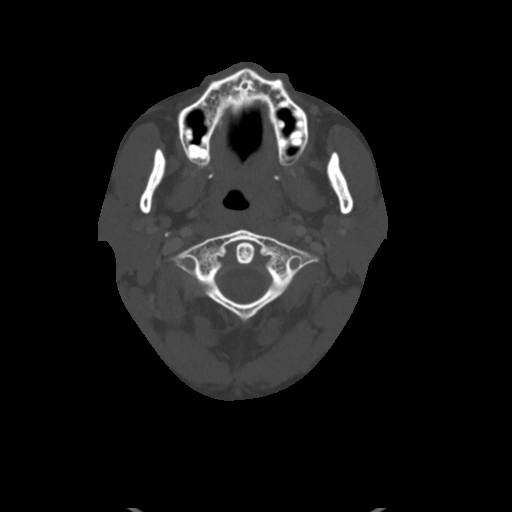

[Series 6: sag neck · sagittal · 0.47mm/px · 5 of 125 slices shown, 6 images]
[im 42/125  bone]
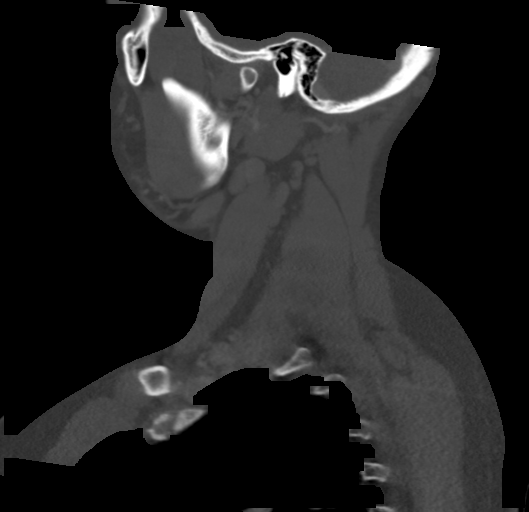
[im 52/125  bone]
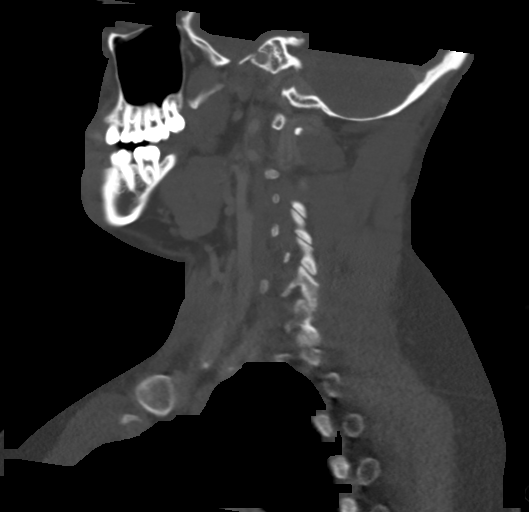
[im 63/125  soft-tissue]
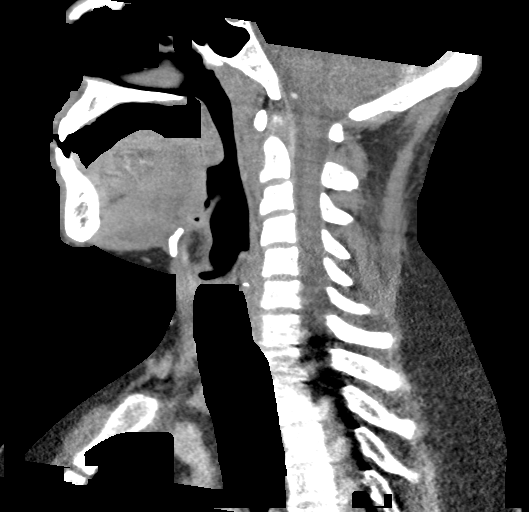
[im 63/125  bone]
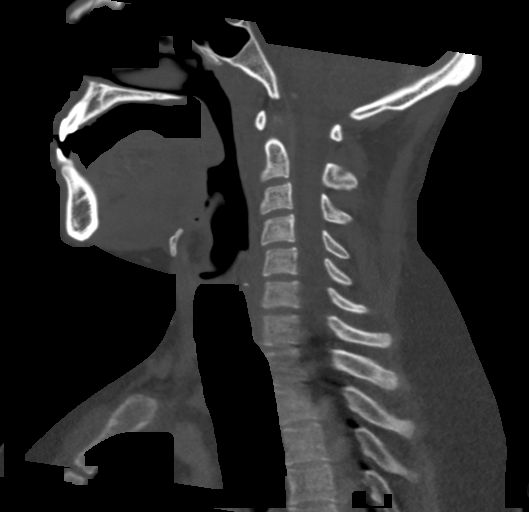
[im 73/125  bone]
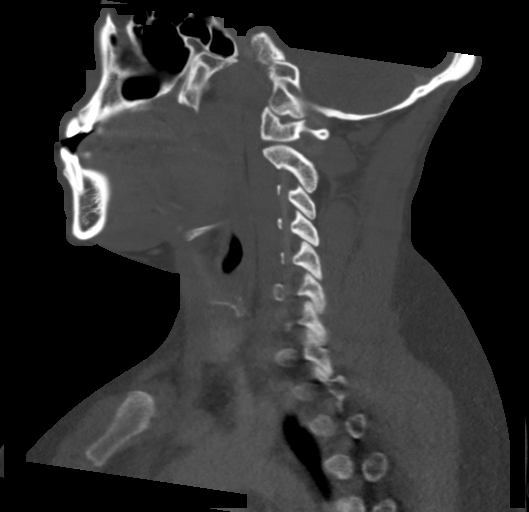
[im 83/125  bone]
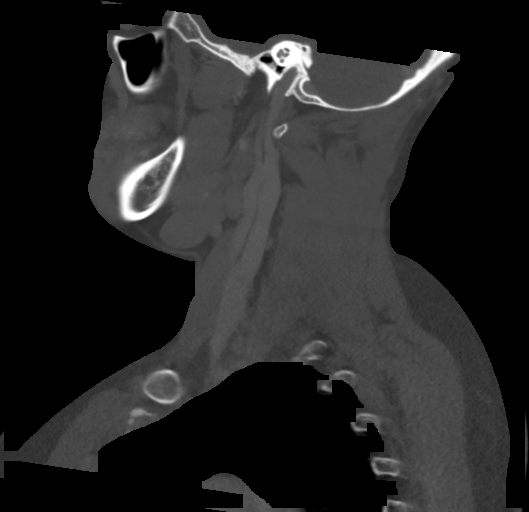

[Series 7: cor neck · coronal · 0.46mm/px · 3 of 113 slices shown]
[im 23/113  bone]
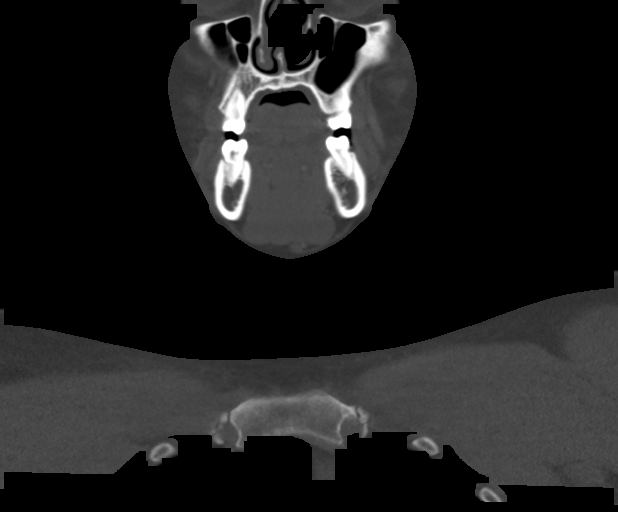
[im 45/113  bone]
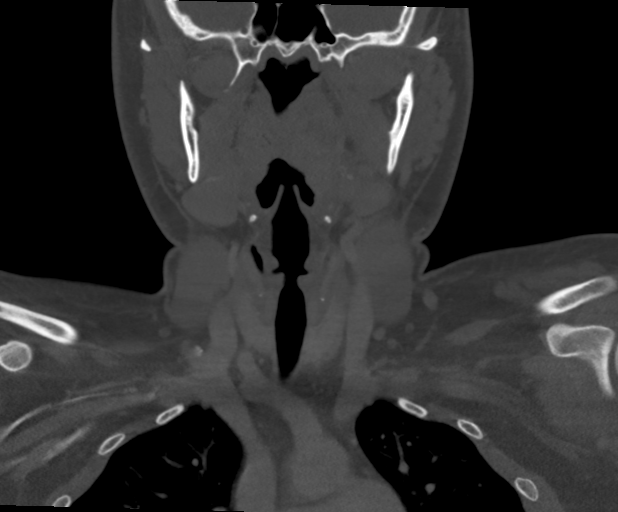
[im 68/113  bone]
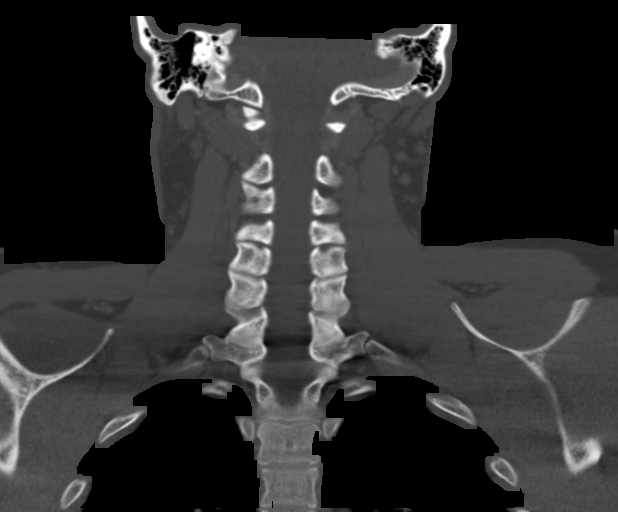

[Series 8: orthogonal ax · axial · 0.46mm/px · z∈[+348,+495]mm · 4 of 125 slices shown]
[im 25/125  bone]
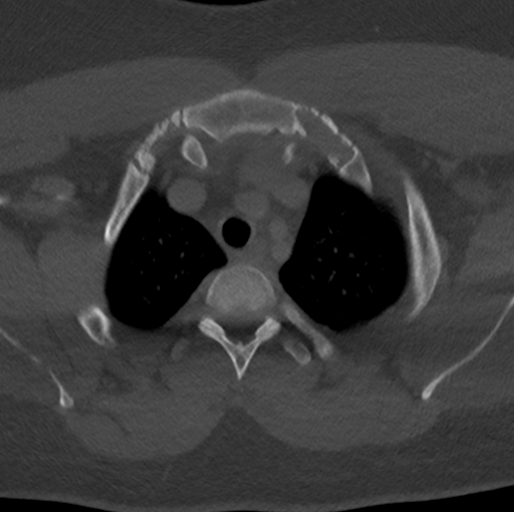
[im 50/125  bone]
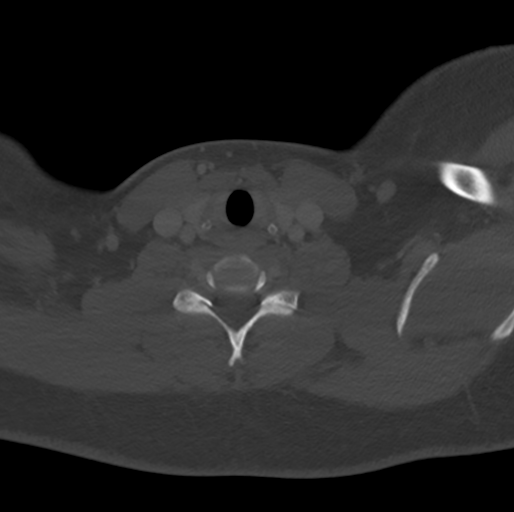
[im 75/125  bone]
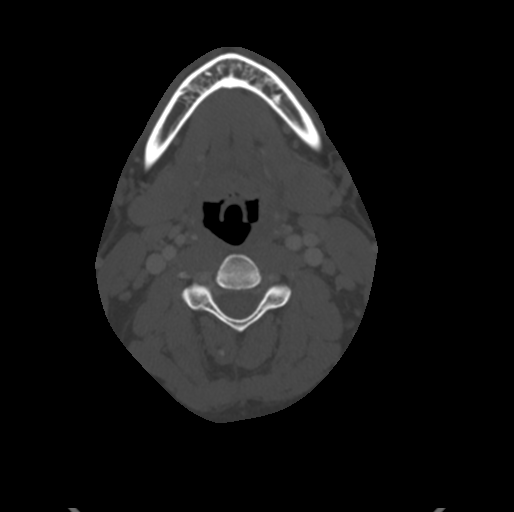
[im 100/125  bone]
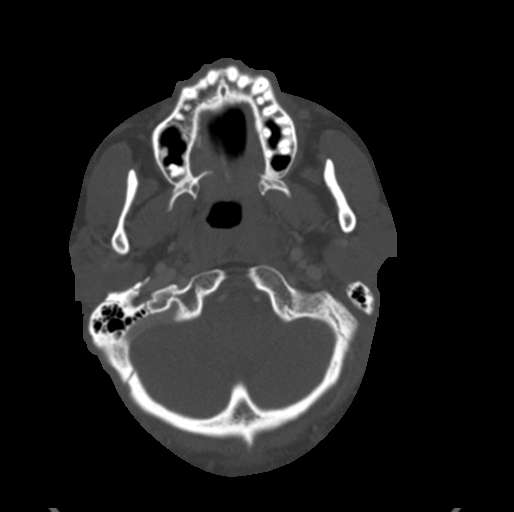

[16 of 33 positions shown; findings below may reference images not displayed]

FINDINGS: Pharynx and larynx: The palatine tonsils are enlarged bilaterally,
left greater than right. There is heterogeneous enhancement of the
left palatine tonsil with an ill-defined low-density area
measurement 1.9 cm. No discrete drainable abscess is present.
Inflammatory changes extend into the left parapharyngeal fat.
Edematous changes extend to the aryepiglottic fold on the left and
into the posterior left oropharynx. The airway is patent. Soft
palate is no epiglottis are within normal limits. Vocal cords are
midline and symmetric.

Salivary glands: Submandibular and parotid glands are normal
bilaterally.

Thyroid: Normal.

Lymph nodes: Left than right enlarged level 2 and level 3 lymph
nodes are likely reactive.

Vascular: Negative.

Limited intracranial: Within normal limits.

Visualized orbits: Visualized orbits are normal.

Mastoids and visualized paranasal sinuses: The paranasal sinuses and
mastoid air cells are clear.

Skeleton: Vertebral body heights alignment are within normal limits.
No focal lytic or blastic lesions are present.

Upper chest: The lung apices are clear.
IMPRESSION: 1. Left-sided tonsillitis and pharyngitis with inflammatory changes
of the left palatine tonsil extending into the left parapharyngeal
fat.
2. Low-density area within the lateral aspect of the left palatine
tonsil without a discrete drainable abscess.
3. Reactive left-sided cervical adenopathy.
